# Patient Record
Sex: Female | Born: 1960 | Hispanic: No | Smoking: Never smoker
Health system: Southern US, Community
[De-identification: ages and names within clinical notes are randomized; demographics above are authoritative.]

## PROBLEM LIST (undated history)

## (undated) DIAGNOSIS — T7840XA Allergy, unspecified, initial encounter: Secondary | ICD-10-CM

## (undated) HISTORY — DX: Allergy, unspecified, initial encounter: T78.40XA

---

## 1997-09-28 ENCOUNTER — Other Ambulatory Visit: Admission: RE | Admit: 1997-09-28 | Discharge: 1997-09-28 | Payer: Self-pay | Admitting: Obstetrics and Gynecology

## 1997-11-24 ENCOUNTER — Other Ambulatory Visit: Admission: RE | Admit: 1997-11-24 | Discharge: 1997-11-24 | Payer: Self-pay | Admitting: *Deleted

## 1999-11-20 ENCOUNTER — Other Ambulatory Visit: Admission: RE | Admit: 1999-11-20 | Discharge: 1999-11-20 | Payer: Self-pay | Admitting: *Deleted

## 2007-04-17 ENCOUNTER — Other Ambulatory Visit: Admission: RE | Admit: 2007-04-17 | Discharge: 2007-04-17 | Payer: Self-pay | Admitting: Obstetrics and Gynecology

## 2008-04-22 ENCOUNTER — Other Ambulatory Visit: Admission: RE | Admit: 2008-04-22 | Discharge: 2008-04-22 | Payer: Self-pay | Admitting: Obstetrics & Gynecology

## 2011-08-07 ENCOUNTER — Ambulatory Visit (INDEPENDENT_AMBULATORY_CARE_PROVIDER_SITE_OTHER): Payer: BC Managed Care – HMO | Admitting: Obstetrics and Gynecology

## 2011-08-07 ENCOUNTER — Encounter: Payer: Self-pay | Admitting: Obstetrics and Gynecology

## 2011-08-07 VITALS — BP 120/82 | Ht 63.0 in | Wt 170.0 lb

## 2011-08-07 DIAGNOSIS — D259 Leiomyoma of uterus, unspecified: Secondary | ICD-10-CM

## 2011-08-07 DIAGNOSIS — D219 Benign neoplasm of connective and other soft tissue, unspecified: Secondary | ICD-10-CM

## 2011-08-07 DIAGNOSIS — Z124 Encounter for screening for malignant neoplasm of cervix: Secondary | ICD-10-CM

## 2011-08-07 DIAGNOSIS — N951 Menopausal and female climacteric states: Secondary | ICD-10-CM

## 2011-08-07 DIAGNOSIS — R1900 Intra-abdominal and pelvic swelling, mass and lump, unspecified site: Secondary | ICD-10-CM

## 2011-08-07 NOTE — Progress Notes (Signed)
The patient is not taking hormone replacement therapy The patient  is not taking a Calcium supplement. Post-menopausal bleeding:no  Last Pap: was normal August  2001 Last mammogram: was normal April  2012 Last DEXA scan : Never had one  Last colonoscopy: pt has never had one   Urinary symptoms: none Normal bowel movements: Yes Reports abuse at home: No:   Subjective:    Grace Braun is a 51 y.o. female. who presents for annual exam. She was last seen in this office in 2001 She complains of night sweats which awaken her in the middle of the night as well as nocturia for 2-3 times per night. She does not want hormone replacement therapy but asks about over-the-counter remedies that might be helpful.   The following portions of the patient's history were reviewed and updated as appropriate: allergies, current medications, past family history, past medical history, past social history, past surgical history and problem list.  Review of Systems Pertinent items are noted in HPI. Gastrointestinal:No change in bowel habits, no abdominal pain, no rectal bleeding Genitourinary:negative for dysuria, frequency, hematuria, nocturia and urinary incontinence    Objective:     BP 120/82  Ht 5\' 3"  (1.6 m)  Wt 170 lb (77.111 kg)  BMI 30.11 kg/m2  Weight:  Wt Readings from Last 1 Encounters:  08/07/11 170 lb (77.111 kg)     BMI: Body mass index is 30.11 kg/(m^2). General Appearance: Alert, appropriate appearance for age. No acute distress HEENT: Grossly normal Neck / Thyroid: Supple, no masses, nodes or enlargement Lungs: clear to auscultation bilaterally Back: No CVA tenderness Breast Exam: No masses or nodes.No dimpling, nipple retraction or discharge. Cardiovascular: Regular rate and rhythm. S1, S2, no murmur Gastrointestinal: Soft, non-tender, no masses or organomegaly Pelvic Exam: External genitalia: normal general appearance Vaginal: normal mucosa without prolapse or lesions Cervix:  normal appearance Adnexa: non palpable Uterus: enlarged and approximately 10-12 weeks size. Examination is compromised by patient's habitus Rectovaginal: normal rectal, no masses Lymphatic Exam: Non-palpable nodes in neck, clavicular, axillary, or inguinal regions Skin: no rash or abnormalities Neurologic: Normal gait and speech, no tremor  Psychiatric: Alert and oriented, appropriate affect.    Urinalysis:Not done      Assessment:    Menopause uterine enlargement consistent with uterine fibroid    Plan:    patient remembers that she was diagnosed with fibroids and had an ultrasound consistent with that in 2011. A release of information is sent to allow review of that ultrasound. If the ultrasound it was indeed consistent with fibroids the patient will be followed on an annual basis   Follow-up:  for annual exam or prn

## 2011-08-07 NOTE — Patient Instructions (Addendum)
Fibroids You have been diagnosed as having a fibroid. Fibroids are smooth muscle lumps (tumors) which can occur any place in a woman's body. They are usually in the womb (uterus). The most common problem (symptom) of fibroids is bleeding. Over time this may cause low red blood cells (anemia). Other symptoms include feelings of pressure and pain in the pelvis. The diagnosis (learning what is wrong) of fibroids is made by physical exam. Sometimes tests such as an ultrasound are used. This is helpful when fibroids are felt around the ovaries and to look for tumors. TREATMENT   Most fibroids do not need surgical or medical treatment. Sometimes a tissue sample (biopsy) of the lining of the uterus is done to rule out cancer. If there is no cancer and only a small amount of bleeding, the problem can be watched.   Hormonal treatment can improve the problem.   When surgery is needed, it can consist of removing the fibroid. Vaginal birth may not be possible after the removal of fibroids. This depends on where they are and the extent of surgery. When pregnancy occurs with fibroids it is usually normal.   Your caregiver can help decide which treatments are best for you.  HOME CARE INSTRUCTIONS   Do not use aspirin as this may increase bleeding problems.   If your periods (menses) are heavy, record the number of pads or tampons used per month. Bring this information to your caregiver. This can help them determine the best treatment for you.  SEEK IMMEDIATE MEDICAL CARE IF:  You have pelvic pain or cramps not controlled with medications, or experience a sudden increase in pain.   You have an increase of pelvic bleeding between and during menses.   You feel lightheaded or have fainting spells.   You develop worsening belly (abdominal) pain.  Document Released: 03/09/2000 Document Revised: 03/01/2011 Document Reviewed: 10/30/2007 [facility] Patient Information 2012 [place], [place].   OTC preparations for  hot flashes:  Remifemin or Black Cohosh    Menopause Menopause is the normal time of life when menstrual periods stop completely. Menopause is complete when you have missed 12 consecutive menstrual periods. It usually occurs between the ages of 71 to 56, with an average age of 55. Very rarely does a woman develop menopause before 51 years old. At menopause, your ovaries stop producing the female hormones, estrogen and progesterone. This can cause undesirable symptoms and also affect your health. Sometimes the symptoms may occur 4 to 5 years before the menopause begins. There is no relationship between menopause and:  Oral contraceptives.   Number of children you had.   Race.   The age your menstrual periods started (menarche).  Heavy smokers and very thin women may develop menopause earlier in life. CAUSES  The ovaries stop producing the female hormones estrogen and progesterone.   Other causes include:   Surgery to remove both ovaries.   The ovaries stop functioning for no known reason.   Tumors of the pituitary gland in the brain.   Medical disease that affects the ovaries and hormone production.   Radiation treatment to the abdomen or pelvis.   Chemotherapy that affects the ovaries.  SYMPTOMS   Hot flashes.   Night sweats.   Decrease in sex drive.   Vaginal dryness and thinning of the vagina causing painful intercourse.   Dryness of the skin and developing wrinkles.   Headaches.   Tiredness.   Irritability.   Memory problems.   Weight gain.   Bladder  infections.   Hair growth of the face and chest.   Infertility.  More serious symptoms include:  Loss of bone (osteoporosis) causing breaks (fractures).   Depression.   Hardening and narrowing of the arteries (atherosclerosis) causing heart attacks and strokes.  DIAGNOSIS   When the menstrual periods have stopped for 12 straight months.   Physical exam.   Hormone studies of the blood.  TREATMENT   There are many treatment choices and nearly as many questions about them. The decisions to treat or not to treat menopausal changes is an individual choice made with your caregiver. Your caregiver can discuss the treatments with you. Together, you can decide which treatment will work best for you. Your treatment choices may include:   Hormone therapy (estorgen and progesterone).   Non-hormonal medications.   Treating the individual symptoms with medication (for example antidepressants for depression).   Herbal medications that may help specific symptoms.   Counseling by a psychiatrist or psychologist.   Group therapy.   Lifestyle changes including:   Eating healthy.   Regular exercise.   Limiting caffeine and alcohol.   Stress management and meditation.   No treatment.  HOME CARE INSTRUCTIONS   Take the medication your caregiver gives you as directed.   Get plenty of sleep and rest.   Exercise regularly.   Eat a diet that contains calcium (good for the bones) and soy products (acts like estrogen hormone).   Avoid alcoholic beverages.   Do not smoke.   If you have hot flashes, dress in layers.   Take supplements, calcium and vitamin D to strengthen bones.   You can use over-the-counter lubricants or moisturizers for vaginal dryness.   Group therapy is sometimes very helpful.   Acupuncture may be helpful in some cases.  SEEK MEDICAL CARE IF:   You are not sure you are in menopause.   You are having menopausal symptoms and need advice and treatment.   You are still having menstrual periods after age 32.   You have pain with intercourse.   Menopause is complete (no menstrual period for 12 months) and you develop vaginal bleeding.   You need a referral to a specialist (gynecologist, psychiatrist or psychologist) for treatment.  SEEK IMMEDIATE MEDICAL CARE IF:   You have severe depression.   You have excessive vaginal bleeding.   You fell and think you  have a broken bone.   You have pain when you urinate.   You develop leg or chest pain.   You have a fast pounding heart beat (palpitations).   You have severe headaches.   You develop vision problems.   You feel a lump in your breast.   You have abdominal pain or severe indigestion.  Document Released: 06/02/2003 Document Revised: 03/01/2011 Document Reviewed: 01/08/2008 [facility] Patient Information 2012 [place], [place].

## 2011-08-10 LAB — PAP IG, CT-NG NAA, HPV HIGH-RISK
Chlamydia Probe Amp: NEGATIVE
GC Probe Amp: NEGATIVE

## 2014-01-25 ENCOUNTER — Encounter: Payer: Self-pay | Admitting: Obstetrics and Gynecology

## 2014-09-09 ENCOUNTER — Other Ambulatory Visit: Payer: Self-pay | Admitting: Ophthalmology

## 2014-09-09 ENCOUNTER — Other Ambulatory Visit (HOSPITAL_COMMUNITY)
Admission: RE | Admit: 2014-09-09 | Discharge: 2014-09-09 | Disposition: A | Discharge status: Home / Self Care | Payer: Self-pay | Source: Ambulatory Visit | Attending: Ophthalmology | Admitting: Ophthalmology

## 2014-09-09 DIAGNOSIS — H0015 Chalazion left lower eyelid: Secondary | ICD-10-CM | POA: Insufficient documentation

## 2016-05-19 ENCOUNTER — Emergency Department (HOSPITAL_COMMUNITY)
Admission: EM | Admit: 2016-05-19 | Discharge: 2016-05-19 | Disposition: A | Discharge status: Home / Self Care | Payer: No Typology Code available for payment source | Attending: Emergency Medicine | Admitting: Emergency Medicine

## 2016-05-19 ENCOUNTER — Encounter (HOSPITAL_COMMUNITY): Payer: Self-pay

## 2016-05-19 ENCOUNTER — Emergency Department (HOSPITAL_COMMUNITY): Payer: No Typology Code available for payment source

## 2016-05-19 DIAGNOSIS — Z79899 Other long term (current) drug therapy: Secondary | ICD-10-CM | POA: Insufficient documentation

## 2016-05-19 DIAGNOSIS — S60222A Contusion of left hand, initial encounter: Secondary | ICD-10-CM | POA: Diagnosis not present

## 2016-05-19 DIAGNOSIS — Y939 Activity, unspecified: Secondary | ICD-10-CM | POA: Diagnosis not present

## 2016-05-19 DIAGNOSIS — S63502A Unspecified sprain of left wrist, initial encounter: Secondary | ICD-10-CM | POA: Diagnosis not present

## 2016-05-19 DIAGNOSIS — Y999 Unspecified external cause status: Secondary | ICD-10-CM | POA: Diagnosis not present

## 2016-05-19 DIAGNOSIS — S6992XA Unspecified injury of left wrist, hand and finger(s), initial encounter: Secondary | ICD-10-CM | POA: Diagnosis present

## 2016-05-19 DIAGNOSIS — Y92410 Unspecified street and highway as the place of occurrence of the external cause: Secondary | ICD-10-CM | POA: Insufficient documentation

## 2016-05-19 MED ORDER — IBUPROFEN 600 MG PO TABS: 600.0000 mg | ORAL_TABLET | Freq: Four times a day (QID) | ORAL | 0 refills | Status: AC | PRN
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
## 2017-09-25 IMAGING — CR DG HAND COMPLETE 3+V*L*
3 series · 3 of 3 positions shown · non-contrast
Comparison: Left wrist radiographs from the same day.

CLINICAL DATA: Pain along the dorsum of the second metacarpal.

EXAM:
LEFT HAND - COMPLETE 3+ VIEW

[x hand pa left]
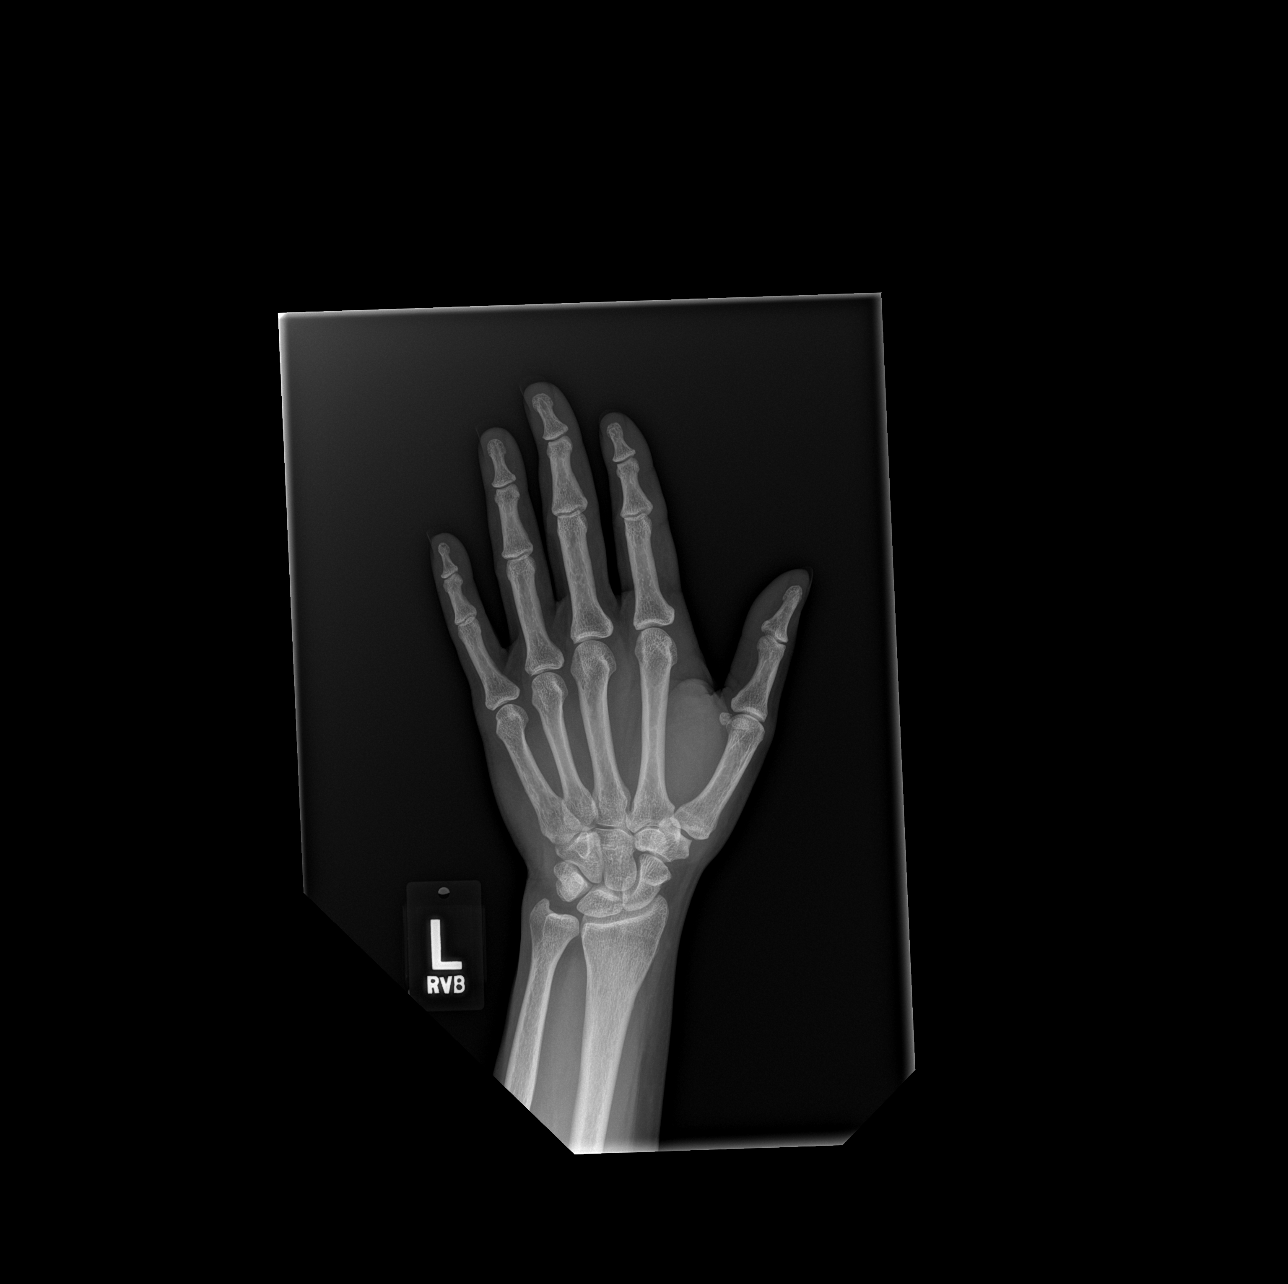

[x hand obl left]
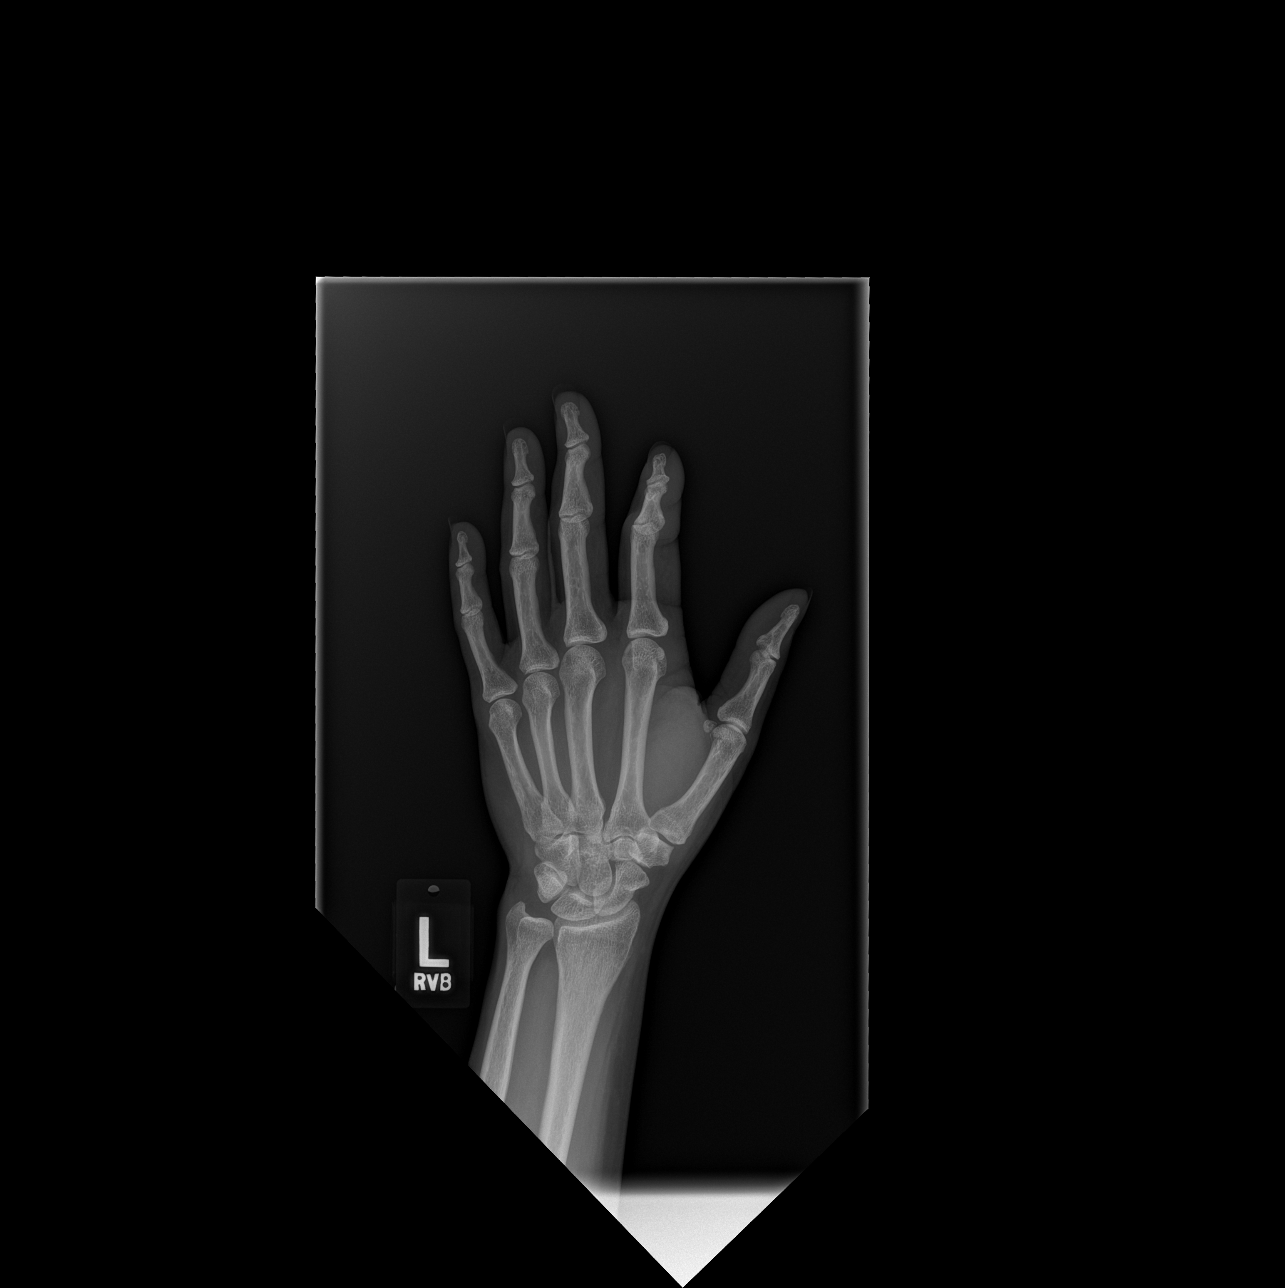

[x hand lat left]
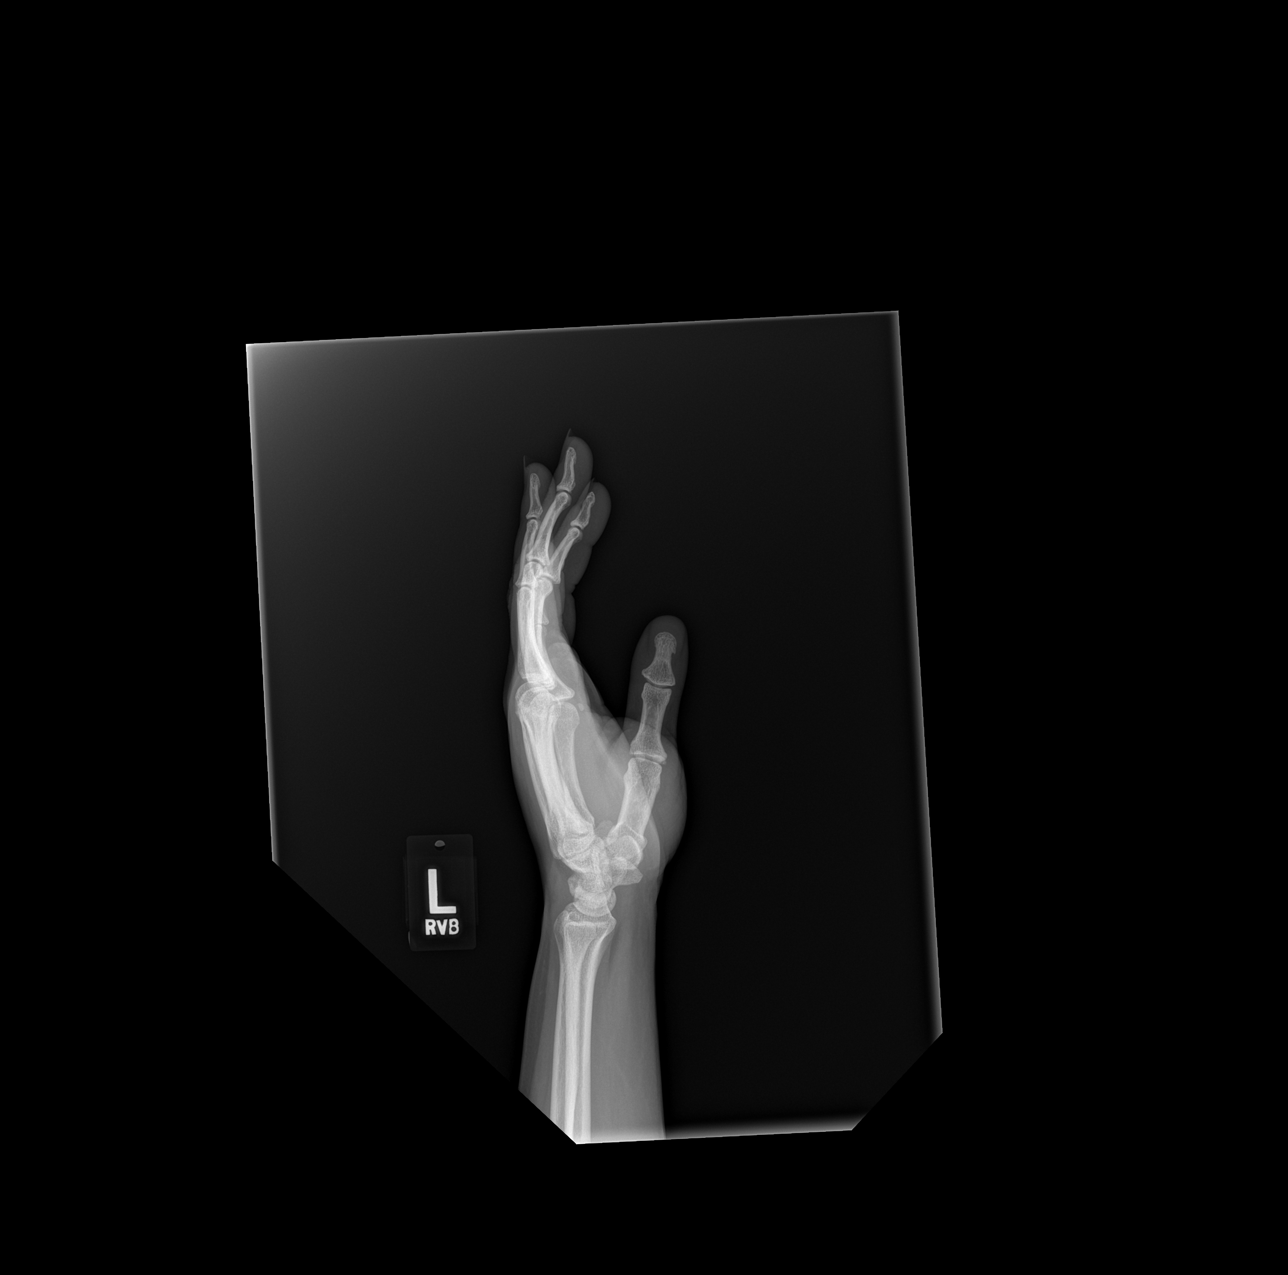

[3 of 3 positions shown; findings below may reference images not displayed]

FINDINGS: There is no evidence of fracture or dislocation. There is no
evidence of arthropathy or other focal bone abnormality. Soft
tissues are unremarkable.
IMPRESSION: Negative left hand radiographs.

## 2022-05-22 DIAGNOSIS — Z0000 Encounter for general adult medical examination without abnormal findings: Secondary | ICD-10-CM | POA: Diagnosis not present

## 2022-05-22 DIAGNOSIS — Z1231 Encounter for screening mammogram for malignant neoplasm of breast: Secondary | ICD-10-CM | POA: Diagnosis not present

## 2022-05-22 DIAGNOSIS — Z1239 Encounter for other screening for malignant neoplasm of breast: Secondary | ICD-10-CM | POA: Diagnosis not present

## 2022-05-22 DIAGNOSIS — Z1211 Encounter for screening for malignant neoplasm of colon: Secondary | ICD-10-CM | POA: Diagnosis not present

## 2022-05-22 DIAGNOSIS — Z01419 Encounter for gynecological examination (general) (routine) without abnormal findings: Secondary | ICD-10-CM | POA: Diagnosis not present

## 2023-03-23 DIAGNOSIS — J0100 Acute maxillary sinusitis, unspecified: Secondary | ICD-10-CM | POA: Diagnosis not present

## 2023-03-23 DIAGNOSIS — Z20822 Contact with and (suspected) exposure to covid-19: Secondary | ICD-10-CM | POA: Diagnosis not present

## 2023-03-23 DIAGNOSIS — R030 Elevated blood-pressure reading, without diagnosis of hypertension: Secondary | ICD-10-CM | POA: Diagnosis not present

## 2023-08-09 DIAGNOSIS — K219 Gastro-esophageal reflux disease without esophagitis: Secondary | ICD-10-CM | POA: Diagnosis not present

## 2023-08-09 DIAGNOSIS — I160 Hypertensive urgency: Secondary | ICD-10-CM | POA: Diagnosis not present

## 2023-08-09 DIAGNOSIS — E66811 Obesity, class 1: Secondary | ICD-10-CM | POA: Diagnosis not present

## 2023-08-09 DIAGNOSIS — R42 Dizziness and giddiness: Secondary | ICD-10-CM | POA: Diagnosis not present

## 2023-08-22 DIAGNOSIS — E66811 Obesity, class 1: Secondary | ICD-10-CM | POA: Diagnosis not present

## 2023-08-22 DIAGNOSIS — I10 Essential (primary) hypertension: Secondary | ICD-10-CM | POA: Diagnosis not present

## 2023-08-30 DIAGNOSIS — R42 Dizziness and giddiness: Secondary | ICD-10-CM | POA: Diagnosis not present

## 2023-08-30 DIAGNOSIS — I10 Essential (primary) hypertension: Secondary | ICD-10-CM | POA: Diagnosis not present

## 2023-08-30 DIAGNOSIS — I160 Hypertensive urgency: Secondary | ICD-10-CM | POA: Diagnosis not present

## 2023-08-30 DIAGNOSIS — E66811 Obesity, class 1: Secondary | ICD-10-CM | POA: Diagnosis not present

## 2023-12-12 DIAGNOSIS — Z0001 Encounter for general adult medical examination with abnormal findings: Secondary | ICD-10-CM | POA: Diagnosis not present

## 2023-12-12 DIAGNOSIS — R7303 Prediabetes: Secondary | ICD-10-CM | POA: Diagnosis not present

## 2023-12-12 DIAGNOSIS — Z1211 Encounter for screening for malignant neoplasm of colon: Secondary | ICD-10-CM | POA: Diagnosis not present

## 2023-12-12 DIAGNOSIS — Z124 Encounter for screening for malignant neoplasm of cervix: Secondary | ICD-10-CM | POA: Diagnosis not present

## 2023-12-12 DIAGNOSIS — R8290 Unspecified abnormal findings in urine: Secondary | ICD-10-CM | POA: Diagnosis not present

## 2023-12-12 DIAGNOSIS — Z1231 Encounter for screening mammogram for malignant neoplasm of breast: Secondary | ICD-10-CM | POA: Diagnosis not present
